# Patient Record
Sex: Male | Born: 1955 | Race: Black or African American | Hispanic: No | Marital: Single | Smoking: Current some day smoker
Health system: Southern US, Community
[De-identification: ages and names within clinical notes are randomized; demographics above are authoritative.]

## PROBLEM LIST (undated history)

## (undated) DIAGNOSIS — K759 Inflammatory liver disease, unspecified: Secondary | ICD-10-CM

## (undated) DIAGNOSIS — F32A Depression, unspecified: Secondary | ICD-10-CM

## (undated) DIAGNOSIS — H9190 Unspecified hearing loss, unspecified ear: Secondary | ICD-10-CM

## (undated) DIAGNOSIS — I10 Essential (primary) hypertension: Secondary | ICD-10-CM

## (undated) DIAGNOSIS — F319 Bipolar disorder, unspecified: Secondary | ICD-10-CM

## (undated) DIAGNOSIS — F1411 Cocaine abuse, in remission: Secondary | ICD-10-CM

## (undated) DIAGNOSIS — K31A0 Gastric intestinal metaplasia, unspecified: Secondary | ICD-10-CM

## (undated) DIAGNOSIS — R0600 Dyspnea, unspecified: Secondary | ICD-10-CM

## (undated) DIAGNOSIS — F1011 Alcohol abuse, in remission: Secondary | ICD-10-CM

## (undated) DIAGNOSIS — K219 Gastro-esophageal reflux disease without esophagitis: Secondary | ICD-10-CM

## (undated) DIAGNOSIS — K3189 Other diseases of stomach and duodenum: Secondary | ICD-10-CM

## (undated) DIAGNOSIS — F329 Major depressive disorder, single episode, unspecified: Secondary | ICD-10-CM

## (undated) DIAGNOSIS — F419 Anxiety disorder, unspecified: Secondary | ICD-10-CM

## (undated) DIAGNOSIS — K7460 Unspecified cirrhosis of liver: Secondary | ICD-10-CM

## (undated) HISTORY — PX: KNEE ARTHROSCOPY: SHX127

## (undated) HISTORY — PX: HAND SURGERY: SHX662

## (undated) HISTORY — PX: COLONOSCOPY: SHX174

---

## 2016-04-29 HISTORY — PX: ESOPHAGOGASTRODUODENOSCOPY: SHX1529

## 2016-05-05 ENCOUNTER — Other Ambulatory Visit: Payer: Self-pay | Admitting: Orthopedic Surgery

## 2016-05-05 DIAGNOSIS — M545 Low back pain: Secondary | ICD-10-CM

## 2016-05-05 DIAGNOSIS — M542 Cervicalgia: Secondary | ICD-10-CM

## 2016-05-16 ENCOUNTER — Other Ambulatory Visit: Payer: Self-pay

## 2016-05-27 ENCOUNTER — Ambulatory Visit
Admission: RE | Admit: 2016-05-27 | Discharge: 2016-05-27 | Disposition: A | Discharge status: Home / Self Care | Payer: Worker's Compensation | Source: Ambulatory Visit | Attending: Orthopedic Surgery | Admitting: Orthopedic Surgery

## 2016-05-27 DIAGNOSIS — M542 Cervicalgia: Secondary | ICD-10-CM

## 2016-05-27 DIAGNOSIS — M545 Low back pain: Secondary | ICD-10-CM

## 2016-08-20 ENCOUNTER — Other Ambulatory Visit: Payer: Self-pay | Admitting: Orthopedic Surgery

## 2016-08-26 ENCOUNTER — Encounter (HOSPITAL_COMMUNITY): Payer: Self-pay | Admitting: *Deleted

## 2016-08-26 NOTE — Progress Notes (Signed)
I called Company secretary, Mr Zelek is not in.  I spoke with Vicky and asked to speak to patient's counselor, she is not in currently.  I told Vicky that patients arrival time has changed and he should arrive at 9:45 AM tomorrow.  Vicky said she would give the information to the person who is bring patient in.

## 2016-08-26 NOTE — Progress Notes (Signed)
Gabriel Haley is a resident at [facility]- he is out of facility at an appointment this morning.  I have left a message to have him call me.Patient has a Veterinary surgeon that oversees medication, counselor is out until 1230 today.  I asked to have medication record faxed if possible. Counselor will have to fax the record.

## 2016-08-27 ENCOUNTER — Encounter (HOSPITAL_COMMUNITY): Payer: Self-pay | Admitting: *Deleted

## 2016-08-27 ENCOUNTER — Ambulatory Visit (HOSPITAL_COMMUNITY)
Admission: RE | Disposition: A | Discharge status: Home / Self Care | Payer: Self-pay | Source: Ambulatory Visit | Attending: Orthopedic Surgery

## 2016-08-27 ENCOUNTER — Ambulatory Visit (HOSPITAL_COMMUNITY): Payer: Worker's Compensation | Admitting: Certified Registered Nurse Anesthetist

## 2016-08-27 ENCOUNTER — Ambulatory Visit (HOSPITAL_COMMUNITY): Payer: Worker's Compensation

## 2016-08-27 ENCOUNTER — Observation Stay (HOSPITAL_COMMUNITY)
Admission: RE | Admit: 2016-08-27 | Discharge: 2016-08-28 | Disposition: A | Discharge status: Home / Self Care | Payer: Worker's Compensation | Source: Ambulatory Visit | Attending: Orthopedic Surgery | Admitting: Orthopedic Surgery

## 2016-08-27 DIAGNOSIS — F319 Bipolar disorder, unspecified: Secondary | ICD-10-CM | POA: Diagnosis not present

## 2016-08-27 DIAGNOSIS — M5412 Radiculopathy, cervical region: Secondary | ICD-10-CM | POA: Diagnosis not present

## 2016-08-27 DIAGNOSIS — F17210 Nicotine dependence, cigarettes, uncomplicated: Secondary | ICD-10-CM | POA: Diagnosis not present

## 2016-08-27 DIAGNOSIS — Z419 Encounter for procedure for purposes other than remedying health state, unspecified: Secondary | ICD-10-CM

## 2016-08-27 DIAGNOSIS — M5410 Radiculopathy, site unspecified: Secondary | ICD-10-CM | POA: Diagnosis present

## 2016-08-27 DIAGNOSIS — I10 Essential (primary) hypertension: Secondary | ICD-10-CM | POA: Diagnosis not present

## 2016-08-27 DIAGNOSIS — M4802 Spinal stenosis, cervical region: Secondary | ICD-10-CM | POA: Insufficient documentation

## 2016-08-27 DIAGNOSIS — M79602 Pain in left arm: Secondary | ICD-10-CM | POA: Diagnosis present

## 2016-08-27 DIAGNOSIS — M542 Cervicalgia: Secondary | ICD-10-CM

## 2016-08-27 DIAGNOSIS — Z79899 Other long term (current) drug therapy: Secondary | ICD-10-CM | POA: Insufficient documentation

## 2016-08-27 DIAGNOSIS — K219 Gastro-esophageal reflux disease without esophagitis: Secondary | ICD-10-CM | POA: Diagnosis not present

## 2016-08-27 DIAGNOSIS — Z01818 Encounter for other preprocedural examination: Secondary | ICD-10-CM

## 2016-08-27 HISTORY — DX: Gastric intestinal metaplasia, unspecified: K31.A0

## 2016-08-27 HISTORY — DX: Cocaine abuse, in remission: F14.11

## 2016-08-27 HISTORY — DX: Bipolar disorder, unspecified: F31.9

## 2016-08-27 HISTORY — DX: Gastro-esophageal reflux disease without esophagitis: K21.9

## 2016-08-27 HISTORY — DX: Other diseases of stomach and duodenum: K31.89

## 2016-08-27 HISTORY — DX: Alcohol abuse, in remission: F10.11

## 2016-08-27 HISTORY — DX: Major depressive disorder, single episode, unspecified: F32.9

## 2016-08-27 HISTORY — DX: Unspecified hearing loss, unspecified ear: H91.90

## 2016-08-27 HISTORY — DX: Unspecified cirrhosis of liver: K74.60

## 2016-08-27 HISTORY — PX: ANTERIOR CERVICAL DECOMP/DISCECTOMY FUSION: SHX1161

## 2016-08-27 HISTORY — DX: Essential (primary) hypertension: I10

## 2016-08-27 HISTORY — DX: Inflammatory liver disease, unspecified: K75.9

## 2016-08-27 HISTORY — DX: Anxiety disorder, unspecified: F41.9

## 2016-08-27 HISTORY — DX: Dyspnea, unspecified: R06.00

## 2016-08-27 HISTORY — DX: Depression, unspecified: F32.A

## 2016-08-27 LAB — SURGICAL PCR SCREEN
MRSA, PCR: NEGATIVE
STAPHYLOCOCCUS AUREUS: NEGATIVE

## 2016-08-27 LAB — COMPREHENSIVE METABOLIC PANEL
ALT: 15 U/L — ABNORMAL LOW (ref 17–63)
ANION GAP: 7 (ref 5–15)
AST: 18 U/L (ref 15–41)
Albumin: 3.8 g/dL (ref 3.5–5.0)
Alkaline Phosphatase: 93 U/L (ref 38–126)
BUN: 10 mg/dL (ref 6–20)
CO2: 24 mmol/L (ref 22–32)
Calcium: 9.3 mg/dL (ref 8.9–10.3)
Chloride: 107 mmol/L (ref 101–111)
Creatinine, Ser: 1.22 mg/dL (ref 0.61–1.24)
GFR calc Af Amer: >60 mL/min (ref >60)
GFR calc non Af Amer: >60 mL/min (ref >60)
Glucose, Bld: 102 mg/dL — ABNORMAL HIGH (ref 65–99)
POTASSIUM: 4.2 mmol/L (ref 3.5–5.1)
SODIUM: 138 mmol/L (ref 135–145)
TOTAL PROTEIN: 7.8 g/dL (ref 6.5–8.1)
Total Bilirubin: 0.7 mg/dL (ref 0.3–1.2)

## 2016-08-27 LAB — CBC WITH DIFFERENTIAL/PLATELET
BASOS PCT: 0 %
Basophils Absolute: 0 10*3/uL (ref 0.0–0.1)
EOS ABS: 0.1 10*3/uL (ref 0.0–0.7)
EOS PCT: 1 %
HCT: 37.3 % — ABNORMAL LOW (ref 39.0–52.0)
HEMOGLOBIN: 12.6 g/dL — AB (ref 13.0–17.0)
LYMPHS PCT: 59 %
Lymphs Abs: 4.7 10*3/uL — ABNORMAL HIGH (ref 0.7–4.0)
MCH: 31 pg (ref 26.0–34.0)
MCHC: 33.8 g/dL (ref 30.0–36.0)
MCV: 91.9 fL (ref 78.0–100.0)
MONO ABS: 0.5 10*3/uL (ref 0.1–1.0)
Monocytes Relative: 6 %
NEUTROS PCT: 34 %
Neutro Abs: 2.8 10*3/uL (ref 1.7–7.7)
PLATELETS: 177 10*3/uL (ref 150–400)
RBC: 4.06 MIL/uL — AB (ref 4.22–5.81)
RDW: 13 % (ref 11.5–15.5)
WBC: 8.1 10*3/uL (ref 4.0–10.5)

## 2016-08-27 LAB — PROTIME-INR
INR: 1.09
PROTHROMBIN TIME: 14.2 s (ref 11.4–15.2)

## 2016-08-27 LAB — APTT: aPTT: 32 seconds (ref 24–36)

## 2016-08-27 SURGERY — ANTERIOR CERVICAL DECOMPRESSION/DISCECTOMY FUSION 2 LEVELS
Anesthesia: General | Site: Spine Cervical | Laterality: Left

## 2016-08-27 MED ORDER — ZOLPIDEM TARTRATE 5 MG PO TABS: 5.0000 mg | ORAL_TABLET | Freq: Every evening | ORAL | Status: DC | PRN

## 2016-08-27 MED ORDER — HYDRALAZINE HCL 20 MG/ML IJ SOLN
10.0000 mg | Freq: Once | INTRAMUSCULAR | Status: AC
  Administered 2016-08-27: 10 mg via INTRAVENOUS

## 2016-08-27 MED ORDER — OXYCODONE-ACETAMINOPHEN 5-325 MG PO TABS
ORAL_TABLET | ORAL | Status: AC
  Filled 2016-08-27: qty 2

## 2016-08-27 MED ORDER — MENTHOL 3 MG MT LOZG: 1.0000 | LOZENGE | OROMUCOSAL | Status: DC | PRN

## 2016-08-27 MED ORDER — HYDROMORPHONE HCL 1 MG/ML IJ SOLN
INTRAMUSCULAR | Status: AC
  Administered 2016-08-27: 0.5 mg via INTRAVENOUS
  Filled 2016-08-27: qty 1

## 2016-08-27 MED ORDER — CLARITHROMYCIN 500 MG PO TABS
500.0000 mg | ORAL_TABLET | Freq: Two times a day (BID) | ORAL | Status: DC
  Administered 2016-08-27: 500 mg via ORAL
  Filled 2016-08-27 (×2): qty 1

## 2016-08-27 MED ORDER — SUCCINYLCHOLINE CHLORIDE 20 MG/ML IJ SOLN
INTRAMUSCULAR | Status: DC | PRN
  Administered 2016-08-27: 100 mg via INTRAVENOUS

## 2016-08-27 MED ORDER — MORPHINE SULFATE (PF) 4 MG/ML IV SOLN: 1.0000 mg | INTRAVENOUS | Status: DC | PRN

## 2016-08-27 MED ORDER — PROPOFOL 10 MG/ML IV BOLUS
INTRAVENOUS | Status: DC | PRN
  Administered 2016-08-27: 200 mg via INTRAVENOUS

## 2016-08-27 MED ORDER — LURASIDONE HCL 40 MG PO TABS
40.0000 mg | ORAL_TABLET | Freq: Every day | ORAL | Status: DC
  Administered 2016-08-27: 40 mg via ORAL
  Filled 2016-08-27: qty 1

## 2016-08-27 MED ORDER — EPINEPHRINE PF 1 MG/ML IJ SOLN
INTRAMUSCULAR | Status: AC
  Filled 2016-08-27: qty 1

## 2016-08-27 MED ORDER — LIDOCAINE HCL (CARDIAC) 20 MG/ML IV SOLN
INTRAVENOUS | Status: DC | PRN
  Administered 2016-08-27: 60 mg via INTRAVENOUS

## 2016-08-27 MED ORDER — DIAZEPAM 5 MG PO TABS
5.0000 mg | ORAL_TABLET | Freq: Four times a day (QID) | ORAL | Status: DC | PRN
  Administered 2016-08-27: 5 mg via ORAL

## 2016-08-27 MED ORDER — PROPOFOL 10 MG/ML IV BOLUS
INTRAVENOUS | Status: AC
  Filled 2016-08-27: qty 40

## 2016-08-27 MED ORDER — PANTOPRAZOLE SODIUM 40 MG PO TBEC: 80.0000 mg | DELAYED_RELEASE_TABLET | Freq: Every day | ORAL | Status: DC

## 2016-08-27 MED ORDER — ONDANSETRON HCL 4 MG/2ML IJ SOLN
INTRAMUSCULAR | Status: AC
  Filled 2016-08-27: qty 2

## 2016-08-27 MED ORDER — BISACODYL 5 MG PO TBEC: 5.0000 mg | DELAYED_RELEASE_TABLET | Freq: Every day | ORAL | Status: DC | PRN

## 2016-08-27 MED ORDER — FENTANYL CITRATE (PF) 250 MCG/5ML IJ SOLN
INTRAMUSCULAR | Status: AC
Start: 2016-08-27 — End: 2016-08-27
  Filled 2016-08-27: qty 5

## 2016-08-27 MED ORDER — POVIDONE-IODINE 7.5 % EX SOLN: Freq: Once | CUTANEOUS | Status: DC

## 2016-08-27 MED ORDER — THROMBIN 20000 UNITS EX SOLR
CUTANEOUS | Status: AC
  Filled 2016-08-27: qty 20000

## 2016-08-27 MED ORDER — HYDRALAZINE HCL 20 MG/ML IJ SOLN
INTRAMUSCULAR | Status: AC
  Filled 2016-08-27: qty 1

## 2016-08-27 MED ORDER — MIDAZOLAM HCL 2 MG/2ML IJ SOLN
INTRAMUSCULAR | Status: DC | PRN
  Administered 2016-08-27: 2 mg via INTRAVENOUS

## 2016-08-27 MED ORDER — ATORVASTATIN CALCIUM 20 MG PO TABS
40.0000 mg | ORAL_TABLET | Freq: Every day | ORAL | Status: DC
  Administered 2016-08-27: 40 mg via ORAL
  Filled 2016-08-27: qty 2

## 2016-08-27 MED ORDER — ONDANSETRON HCL 4 MG/2ML IJ SOLN: 4.0000 mg | Freq: Four times a day (QID) | INTRAMUSCULAR | Status: DC | PRN

## 2016-08-27 MED ORDER — THROMBIN 20000 UNITS EX KIT
PACK | CUTANEOUS | Status: DC | PRN
  Administered 2016-08-27: 20000 [IU] via TOPICAL

## 2016-08-27 MED ORDER — HYDROMORPHONE HCL 1 MG/ML IJ SOLN
0.2500 mg | INTRAMUSCULAR | Status: DC | PRN
  Administered 2016-08-27 (×2): 0.5 mg via INTRAVENOUS

## 2016-08-27 MED ORDER — POTASSIUM CHLORIDE IN NACL 20-0.9 MEQ/L-% IV SOLN: INTRAVENOUS | Status: DC

## 2016-08-27 MED ORDER — MIDAZOLAM HCL 2 MG/2ML IJ SOLN
INTRAMUSCULAR | Status: AC
  Filled 2016-08-27: qty 2

## 2016-08-27 MED ORDER — LACTATED RINGERS IV SOLN
INTRAVENOUS | Status: DC | PRN
  Administered 2016-08-27 (×2): via INTRAVENOUS

## 2016-08-27 MED ORDER — PHENOL 1.4 % MT LIQD
1.0000 | OROMUCOSAL | Status: DC | PRN
  Administered 2016-08-28: 1 via OROMUCOSAL
  Filled 2016-08-27: qty 177

## 2016-08-27 MED ORDER — ACETAMINOPHEN 325 MG PO TABS: 650.0000 mg | ORAL_TABLET | ORAL | Status: DC | PRN

## 2016-08-27 MED ORDER — SENNOSIDES-DOCUSATE SODIUM 8.6-50 MG PO TABS: 1.0000 | ORAL_TABLET | Freq: Every evening | ORAL | Status: DC | PRN

## 2016-08-27 MED ORDER — FENTANYL CITRATE (PF) 100 MCG/2ML IJ SOLN
INTRAMUSCULAR | Status: DC | PRN
  Administered 2016-08-27: 50 ug via INTRAVENOUS
  Administered 2016-08-27: 100 ug via INTRAVENOUS
  Administered 2016-08-27 (×2): 50 ug via INTRAVENOUS
  Administered 2016-08-27: 150 ug via INTRAVENOUS

## 2016-08-27 MED ORDER — HEMOSTATIC AGENTS (NO CHARGE) OPTIME
TOPICAL | Status: DC | PRN
  Administered 2016-08-27: 1 via TOPICAL

## 2016-08-27 MED ORDER — ONDANSETRON HCL 4 MG/2ML IJ SOLN
INTRAMUSCULAR | Status: DC | PRN
  Administered 2016-08-27: 4 mg via INTRAVENOUS

## 2016-08-27 MED ORDER — ACETAMINOPHEN 650 MG RE SUPP: 650.0000 mg | RECTAL | Status: DC | PRN

## 2016-08-27 MED ORDER — SUGAMMADEX SODIUM 200 MG/2ML IV SOLN
INTRAVENOUS | Status: AC
  Filled 2016-08-27: qty 2

## 2016-08-27 MED ORDER — PANTOPRAZOLE SODIUM 40 MG IV SOLR: 40.0000 mg | Freq: Every day | INTRAVENOUS | Status: DC

## 2016-08-27 MED ORDER — BUPIVACAINE HCL (PF) 0.25 % IJ SOLN
INTRAMUSCULAR | Status: AC
  Filled 2016-08-27: qty 30

## 2016-08-27 MED ORDER — OXYCODONE-ACETAMINOPHEN 5-325 MG PO TABS
1.0000 | ORAL_TABLET | ORAL | Status: DC | PRN
  Administered 2016-08-27 – 2016-08-28 (×4): 2 via ORAL
  Filled 2016-08-27 (×3): qty 2

## 2016-08-27 MED ORDER — DOCUSATE SODIUM 100 MG PO CAPS
100.0000 mg | ORAL_CAPSULE | Freq: Two times a day (BID) | ORAL | Status: DC
  Administered 2016-08-27: 100 mg via ORAL
  Filled 2016-08-27: qty 1

## 2016-08-27 MED ORDER — SODIUM CHLORIDE 0.9% FLUSH: 3.0000 mL | INTRAVENOUS | Status: DC | PRN

## 2016-08-27 MED ORDER — CEFAZOLIN SODIUM-DEXTROSE 2-4 GM/100ML-% IV SOLN
2.0000 g | INTRAVENOUS | Status: AC
Start: 2016-08-27 — End: 2016-08-27
  Administered 2016-08-27: 2 g via INTRAVENOUS
  Filled 2016-08-27: qty 100

## 2016-08-27 MED ORDER — MUPIROCIN 2 % EX OINT
1.0000 "application " | TOPICAL_OINTMENT | Freq: Two times a day (BID) | CUTANEOUS | Status: DC
  Administered 2016-08-27: 1 via TOPICAL

## 2016-08-27 MED ORDER — LACTATED RINGERS IV SOLN
INTRAVENOUS | Status: DC
  Administered 2016-08-27: 11:00:00 via INTRAVENOUS

## 2016-08-27 MED ORDER — ALUM & MAG HYDROXIDE-SIMETH 200-200-20 MG/5ML PO SUSP: 30.0000 mL | Freq: Four times a day (QID) | ORAL | Status: DC | PRN

## 2016-08-27 MED ORDER — ONDANSETRON HCL 4 MG PO TABS: 4.0000 mg | ORAL_TABLET | Freq: Four times a day (QID) | ORAL | Status: DC | PRN

## 2016-08-27 MED ORDER — SODIUM CHLORIDE 0.9% FLUSH
3.0000 mL | Freq: Two times a day (BID) | INTRAVENOUS | Status: DC
  Administered 2016-08-27: 3 mL via INTRAVENOUS

## 2016-08-27 MED ORDER — DIAZEPAM 5 MG PO TABS
ORAL_TABLET | ORAL | Status: AC
  Filled 2016-08-27: qty 1

## 2016-08-27 MED ORDER — BUPIVACAINE LIPOSOME 1.3 % IJ SUSP
20.0000 mL | INTRAMUSCULAR | Status: DC
  Filled 2016-08-27: qty 20

## 2016-08-27 MED ORDER — CEFAZOLIN SODIUM-DEXTROSE 2-4 GM/100ML-% IV SOLN
2.0000 g | Freq: Three times a day (TID) | INTRAVENOUS | Status: AC
  Administered 2016-08-27 – 2016-08-28 (×2): 2 g via INTRAVENOUS
  Filled 2016-08-27 (×2): qty 100

## 2016-08-27 MED ORDER — ROCURONIUM BROMIDE 100 MG/10ML IV SOLN
INTRAVENOUS | Status: DC | PRN
  Administered 2016-08-27: 20 mg via INTRAVENOUS
  Administered 2016-08-27: 40 mg via INTRAVENOUS
  Administered 2016-08-27: 10 mg via INTRAVENOUS

## 2016-08-27 MED ORDER — AMLODIPINE BESYLATE 5 MG PO TABS
5.0000 mg | ORAL_TABLET | Freq: Every day | ORAL | Status: DC
  Administered 2016-08-27: 5 mg via ORAL
  Filled 2016-08-27: qty 1

## 2016-08-27 MED ORDER — SUGAMMADEX SODIUM 200 MG/2ML IV SOLN
INTRAVENOUS | Status: DC | PRN
  Administered 2016-08-27: 200 mg via INTRAVENOUS

## 2016-08-27 MED ORDER — FLEET ENEMA 7-19 GM/118ML RE ENEM: 1.0000 | ENEMA | Freq: Once | RECTAL | Status: DC | PRN

## 2016-08-27 MED ORDER — BUPIVACAINE-EPINEPHRINE 0.25% -1:200000 IJ SOLN
INTRAMUSCULAR | Status: DC | PRN
  Administered 2016-08-27: 10 mL

## 2016-08-27 MED ORDER — MUPIROCIN 2 % EX OINT
TOPICAL_OINTMENT | CUTANEOUS | Status: AC
  Administered 2016-08-27: 1 via TOPICAL
  Filled 2016-08-27: qty 22

## 2016-08-27 MED ORDER — BENAZEPRIL HCL 20 MG PO TABS
20.0000 mg | ORAL_TABLET | Freq: Every day | ORAL | Status: DC
  Administered 2016-08-27: 20 mg via ORAL
  Filled 2016-08-27: qty 1

## 2016-08-27 MED ORDER — MORPHINE SULFATE (PF) 2 MG/ML IV SOLN: 1.0000 mg | INTRAVENOUS | Status: DC | PRN

## 2016-08-27 MED ORDER — AMOXICILLIN 500 MG PO CAPS
1000.0000 mg | ORAL_CAPSULE | Freq: Two times a day (BID) | ORAL | Status: DC
  Administered 2016-08-27: 1000 mg via ORAL
  Filled 2016-08-27 (×2): qty 2

## 2016-08-27 MED ORDER — SODIUM CHLORIDE 0.9 % IV SOLN: 250.0000 mL | INTRAVENOUS | Status: DC

## 2016-08-27 MED ORDER — 0.9 % SODIUM CHLORIDE (POUR BTL) OPTIME
TOPICAL | Status: DC | PRN
  Administered 2016-08-27: 1000 mL

## 2016-08-27 MED ORDER — FENTANYL CITRATE (PF) 250 MCG/5ML IJ SOLN
INTRAMUSCULAR | Status: AC
  Filled 2016-08-27: qty 5

## 2016-08-27 SURGICAL SUPPLY — 76 items
BENZOIN TINCTURE PRP APPL 2/3 (GAUZE/BANDAGES/DRESSINGS) ×3 IMPLANT
BIT DRILL NEURO 2X3.1 SFT TUCH (MISCELLANEOUS) ×1 IMPLANT
BIT DRILL SRG 14X2.2XFLT CHK (BIT) ×1 IMPLANT
BIT DRL SRG 14X2.2XFLT CHK (BIT) ×1
BLADE CLIPPER SURG (BLADE) ×6 IMPLANT
BLADE SURG 15 STRL LF DISP TIS (BLADE) ×1 IMPLANT
BLADE SURG 15 STRL SS (BLADE) ×2
BONE VIVIGEN FORMABLE 1.3CC (Bone Implant) ×3 IMPLANT
BUR MATCHSTICK NEURO 3.0 LAGG (BURR) IMPLANT
CARTRIDGE OIL MAESTRO DRILL (MISCELLANEOUS) ×1 IMPLANT
CLOSURE WOUND 1/2 X4 (GAUZE/BANDAGES/DRESSINGS) ×1
COLLAR CERV LO CONTOUR FIRM DE (SOFTGOODS) IMPLANT
CORDS BIPOLAR (ELECTRODE) ×3 IMPLANT
COVER MAYO STAND STRL (DRAPES) ×3 IMPLANT
COVER SURGICAL LIGHT HANDLE (MISCELLANEOUS) ×3 IMPLANT
CRADLE DONUT ADULT HEAD (MISCELLANEOUS) ×3 IMPLANT
DECANTER SPIKE VIAL GLASS SM (MISCELLANEOUS) ×3 IMPLANT
DIFFUSER DRILL AIR PNEUMATIC (MISCELLANEOUS) ×3 IMPLANT
DRAIN JACKSON RD 7FR 3/32 (WOUND CARE) IMPLANT
DRAPE C-ARM 42X72 X-RAY (DRAPES) ×3 IMPLANT
DRAPE POUCH INSTRU U-SHP 10X18 (DRAPES) ×3 IMPLANT
DRAPE SURG 17X23 STRL (DRAPES) ×12 IMPLANT
DRILL BIT SKYLINE 14MM (BIT) ×2
DRILL NEURO 2X3.1 SOFT TOUCH (MISCELLANEOUS) ×3
DURAPREP 26ML APPLICATOR (WOUND CARE) ×3 IMPLANT
ELECT COATED BLADE 2.86 ST (ELECTRODE) ×3 IMPLANT
ELECT REM PT RETURN 9FT ADLT (ELECTROSURGICAL) ×3
ELECTRODE REM PT RTRN 9FT ADLT (ELECTROSURGICAL) ×1 IMPLANT
EVACUATOR SILICONE 100CC (DRAIN) IMPLANT
GAUZE SPONGE 4X4 12PLY STRL (GAUZE/BANDAGES/DRESSINGS) ×3 IMPLANT
GAUZE SPONGE 4X4 16PLY XRAY LF (GAUZE/BANDAGES/DRESSINGS) ×3 IMPLANT
GLOVE BIO SURGEON STRL SZ7 (GLOVE) ×3 IMPLANT
GLOVE BIO SURGEON STRL SZ8 (GLOVE) ×3 IMPLANT
GLOVE BIOGEL PI IND STRL 7.0 (GLOVE) ×2 IMPLANT
GLOVE BIOGEL PI IND STRL 8 (GLOVE) ×1 IMPLANT
GLOVE BIOGEL PI INDICATOR 7.0 (GLOVE) ×4
GLOVE BIOGEL PI INDICATOR 8 (GLOVE) ×2
GOWN STRL REUS W/ TWL LRG LVL3 (GOWN DISPOSABLE) ×1 IMPLANT
GOWN STRL REUS W/ TWL XL LVL3 (GOWN DISPOSABLE) ×1 IMPLANT
GOWN STRL REUS W/TWL LRG LVL3 (GOWN DISPOSABLE) ×2
GOWN STRL REUS W/TWL XL LVL3 (GOWN DISPOSABLE) ×2
INTERLOCK LRDTC CRVCL VBR 6MM (Bone Implant) ×1 IMPLANT
INTERLOCK LRDTC CRVCL VBR 7MM (Bone Implant) ×1 IMPLANT
IV CATH 14GX2 1/4 (CATHETERS) ×3 IMPLANT
KIT BASIN OR (CUSTOM PROCEDURE TRAY) ×3 IMPLANT
KIT ROOM TURNOVER OR (KITS) ×3 IMPLANT
LORDOTIC CERVICAL VBR 6MM SM (Bone Implant) ×3 IMPLANT
LORDOTIC CERVICAL VBR 7MM SM (Bone Implant) ×3 IMPLANT
MANIFOLD NEPTUNE II (INSTRUMENTS) ×3 IMPLANT
NEEDLE PRECISIONGLIDE 27X1.5 (NEEDLE) ×3 IMPLANT
NEEDLE SPNL 20GX3.5 QUINCKE YW (NEEDLE) ×3 IMPLANT
NS IRRIG 1000ML POUR BTL (IV SOLUTION) ×3 IMPLANT
OIL CARTRIDGE MAESTRO DRILL (MISCELLANEOUS) ×3
PACK ORTHO CERVICAL (CUSTOM PROCEDURE TRAY) ×3 IMPLANT
PAD ARMBOARD 7.5X6 YLW CONV (MISCELLANEOUS) ×6 IMPLANT
PATTIES SURGICAL .5 X.5 (GAUZE/BANDAGES/DRESSINGS) IMPLANT
PATTIES SURGICAL .5 X1 (DISPOSABLE) ×3 IMPLANT
PIN DISTRACTION 14 (PIN) ×6 IMPLANT
PLATE SKYLINE TWO LEVEL 28MM (Plate) ×3 IMPLANT
SCREW SKYLINE VAR OS 14MM (Screw) ×18 IMPLANT
SPONGE INTESTINAL PEANUT (DISPOSABLE) ×6 IMPLANT
SPONGE SURGIFOAM ABS GEL 100 (HEMOSTASIS) ×3 IMPLANT
STRIP CLOSURE SKIN 1/2X4 (GAUZE/BANDAGES/DRESSINGS) ×2 IMPLANT
SURGIFLO W/THROMBIN 8M KIT (HEMOSTASIS) IMPLANT
SUT MNCRL AB 4-0 PS2 18 (SUTURE) ×3 IMPLANT
SUT SILK 4 0 (SUTURE)
SUT SILK 4-0 18XBRD TIE 12 (SUTURE) IMPLANT
SUT VIC AB 2-0 CT2 18 VCP726D (SUTURE) ×3 IMPLANT
SYR BULB IRRIGATION 50ML (SYRINGE) ×3 IMPLANT
SYR CONTROL 10ML LL (SYRINGE) ×9 IMPLANT
TAPE CLOTH 4X10 WHT NS (GAUZE/BANDAGES/DRESSINGS) ×3 IMPLANT
TAPE UMBILICAL COTTON 1/8X30 (MISCELLANEOUS) ×3 IMPLANT
TOWEL OR 17X24 6PK STRL BLUE (TOWEL DISPOSABLE) ×3 IMPLANT
TOWEL OR 17X26 10 PK STRL BLUE (TOWEL DISPOSABLE) ×3 IMPLANT
WATER STERILE IRR 1000ML POUR (IV SOLUTION) ×3 IMPLANT
YANKAUER SUCT BULB TIP NO VENT (SUCTIONS) ×3 IMPLANT

## 2016-08-27 NOTE — Transfer of Care (Signed)
Immediate Anesthesia Transfer of Care Note  Patient: Gabriel Haley  Procedure(s) Performed: Procedure(s) with comments: ANTERIOR CERVICAL DECOMPRESSION FUSION, CERVICAL 3-4, CERVICAL 4-5 WITH INSTRUMENATION AND ALLOGRAFT (Left) - ANTERIOR CERVICAL DECOMPRESSION FUSION, CERVICAL 3-4, CERVICAL 4-5 WITH INSTRUMENATION AND ALLOGRAFT  Patient Location: PACU  Anesthesia Type:General  Level of Consciousness: awake, alert  and oriented  Airway & Oxygen Therapy: Patient Spontanous Breathing and Patient connected to nasal cannula oxygen  Post-op Assessment: Report given to RN, Post -op Vital signs reviewed and stable and Patient moving all extremities  Post vital signs: Reviewed and stable  Last Vitals:  Vitals:   08/27/16 1103 08/27/16 1615  BP: (!) 152/80   Pulse: (!) 51   Resp: 20   Temp: 36.8 C (P) 36.3 C    Last Pain:  Vitals:   08/27/16 1103  TempSrc: Oral  PainSc:       Patients Stated Pain Goal: 5 (08/27/16 1021)  Complications: No apparent anesthesia complications

## 2016-08-27 NOTE — Anesthesia Procedure Notes (Signed)
Procedure Name: Intubation Date/Time: 08/27/2016 1:03 PM Performed by: Gwenyth Allegra Pre-anesthesia Checklist: Patient identified, Emergency Drugs available, Suction available and Patient being monitored Patient Re-evaluated:Patient Re-evaluated prior to inductionOxygen Delivery Method: Circle system utilized Preoxygenation: Pre-oxygenation with 100% oxygen Intubation Type: IV induction Ventilation: Mask ventilation without difficulty Grade View: Grade I Tube type: Oral Tube size: 8.0 mm Number of attempts: 1 Airway Equipment and Method: Stylet and Video-laryngoscopy Placement Confirmation: ETT inserted through vocal cords under direct vision and breath sounds checked- equal and bilateral Secured at: 23 cm Tube secured with: Tape Dental Injury: Teeth and Oropharynx as per pre-operative assessment
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
## 2016-08-27 NOTE — Progress Notes (Signed)
Orthopedic Tech Progress Note Patient Details:  Gabriel Haley [birth year] [id number]  Ortho Devices Type of Ortho Device: [place] cervical collar Ortho Device/Splint Location: bedside Ortho Device/Splint Interventions: Casandra Doffing 08/27/2016, 6:52 PM

## 2016-08-28 ENCOUNTER — Encounter (HOSPITAL_COMMUNITY): Payer: Self-pay | Admitting: Orthopedic Surgery

## 2016-08-28 DIAGNOSIS — M5412 Radiculopathy, cervical region: Secondary | ICD-10-CM | POA: Diagnosis not present

## 2016-08-28 MED ORDER — DEXAMETHASONE 4 MG PO TABS
4.0000 mg | ORAL_TABLET | Freq: Every day | ORAL | Status: AC
  Administered 2016-08-28: 4 mg via ORAL
  Filled 2016-08-28: qty 1

## 2016-08-28 MED FILL — Thrombin For Soln 20000 Unit: CUTANEOUS | Qty: 1 | Status: AC
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
## 2016-08-28 NOTE — Anesthesia Postprocedure Evaluation (Addendum)
Anesthesia Post Note  Patient: Gregary CromerWilliam E Varon  Procedure(s) Performed: Procedure(s) (LRB): ANTERIOR CERVICAL DECOMPRESSION FUSION, CERVICAL 3-4, CERVICAL 4-5 WITH INSTRUMENATION AND ALLOGRAFT (Left)  Patient location during evaluation: PACU Anesthesia Type: General Level of consciousness: awake and alert Pain management: pain level controlled Vital Signs Assessment: post-procedure vital signs reviewed and stable Respiratory status: spontaneous breathing, nonlabored ventilation, respiratory function stable and patient connected to nasal cannula oxygen Cardiovascular status: blood pressure returned to baseline and stable Postop Assessment: no signs of nausea or vomiting Anesthetic complications: no       Last Vitals:  Vitals:   08/28/16 0400 08/28/16 0805  BP: (!) 146/97 131/86  Pulse: 80 65  Resp: 20 20  Temp: 37 C 37 C    Last Pain:  Vitals:   08/28/16 0900  TempSrc:   PainSc: 4                  Jessicah Croll

## 2016-08-28 NOTE — Progress Notes (Signed)
Pt doing well. Pt and mother given D/C instructions with Rx's, verbal understanding was provided. Pt's incision is clean and dry with no sign of infection. Pt's IV was removed prior to D/C. Pt D/C'd home via wheelchair @ 1030 per MD order. Pt is stable @ D/C and has no other needs at this time. Rema FendtAshley Vyctoria Dickman, RN

## 2016-08-28 NOTE — Progress Notes (Signed)
    Patient doing well Patient denies R or L arm pain Able to tolerate solids and liquids and pills, but with discomfort   Physical Exam: Vitals:   08/28/16 0005 08/28/16 0400  BP: 135/87 (!) 146/97  Pulse: 85 80  Resp: 20 20  Temp: 98.6 F (37 C) 98.6 F (37 C)   Neck soft/supple Dressing in place NVI  POD #1 s/p C3-5 ACDF doing well  - encourage ambulation - Percocet for pain, Valium for muscle spasms - likely d/c home later today

## 2016-08-28 NOTE — Discharge Instructions (Signed)
Anterior Cervical Diskectomy and Fusion, Care After Refer to this sheet in the next few weeks. These instructions provide you with information about caring for yourself after your procedure. Your health care provider may also give you more specific instructions. Your treatment has been planned according to current medical practices, but problems sometimes occur. Call your health care provider if you have any problems or questions after your procedure. What can I expect after the procedure? After the procedure, it is common to have:  Neck pain.  Discomfort when swallowing.  Slight hoarseness. Follow these instructions at home: If You Have a Neck Brace:   Wear it as told by your health care provider. Remove it only as told by your health care provider.  Keep the brace clean and dry. Incision care    Follow instructions from your health care provider about how to take care of the cut made during surgery (incision). Make sure you:  Wash your hands with soap and water before you change your bandage (dressing). If soap and water are not available, use hand sanitizer.  Change your dressing as told by your health care provider.  Leave stitches (sutures), skin glue, or adhesive strips in place. These skin closures may need to stay in place for 2 weeks or longer. If adhesive strip edges start to loosen and curl up, you may trim the loose edges. Do not remove adhesive strips completely unless your health care provider tells you to do that.  Check your incision every day for signs of infection. Watch for:  Redness, swelling, or pain.  Fluid or blood.  Warmth.  Pus or a bad smell. Managing pain, stiffness, and swelling   Take over-the-counter and prescription medicines only as told by your health care provider.  If directed, apply ice to the injured area.  Put ice in a plastic bag.  Place a towel between your skin and the bag.  Leave the ice on for 20 minutes, 2-3 times per  day. Activity   Return to your normal activities as told by your health care provider. Ask your health care provider what activities are safe for you.  Do exercises as told by your health care provider.  Do not lift anything that is heavier than 10 lb (4.5 kg). General instructions   Do not drive or operate heavy machinery while taking prescription pain medicines.  Do not use any tobacco products, including cigarettes, chewing tobacco, or e-cigarettes. Tobacco can delay healing. If you need help quitting, ask your health care provider.  Keep all follow-up visits and physical therapy appointments as told by your health care provider. This is important. Contact a health care provider if:  You have a fever.  You have redness, swelling, or pain around your incision.  You have fluid or blood coming from your incision.  You have pus or a bad smell coming from your incision.  You have pain that is not controlled by your pain medicine.  You have increasing hoarseness or trouble swallowing. Get help right away if:  You have severe pain.  You have sudden numbness or weakness in your arms.  You have warmth, tenderness, or swelling in your calf.  You have chest pain.  You have difficulty breathing. This information is not intended to replace advice given to you by your health care provider. Make sure you discuss any questions you have with your health care provider. Document Released: 05/11/2015 Document Revised: 09/20/2015 Document Reviewed: 03/29/2015 Elsevier Interactive Patient Education  2017 [employer]

## 2016-09-03 NOTE — Discharge Summary (Signed)
Patient ID: Gabriel Haley MRN: [id number] DOB/AGE: 11-19-1955 61 y.o.  Admit date: 08/27/2016 Discharge date: 08/28/2016  Admission Diagnoses:  Active Problems:   Gabriel Haley   Discharge Diagnoses:  Same  Past Medical History:  Diagnosis Date  . Anxiety    attack 10 years ago (08/26/16)  . Bipolar disorder (HCC)   . Cirrhosis of liver (HCC)   . Depression   . Dyspnea    exertion  . GERD (gastroesophageal reflux disease)   . Hearing loss    left  . Hepatitis    Hepatitis C  . History of cocaine abuse   . History of ETOH abuse   . Hypertension   . Intestinal metaplasia of gastric mucosa    HelicoBacter Pyloric gastritis    Surgeries: Procedure(s): ANTERIOR CERVICAL DECOMPRESSION FUSION, CERVICAL 3-4, CERVICAL 4-5 WITH INSTRUMENATION AND ALLOGRAFT on 08/27/2016   Consultants:  None  Discharged Condition: Improved  Hospital Course: Gabriel Haley is an 61 y.o. male who was admitted 08/27/2016 for operative treatment of Gabriel Haley. Patient has severe unremitting pain that affects sleep, daily activities, and work/hobbies. After pre-op clearance the patient was taken to the operating room on 08/27/2016 and underwent  Procedure(s): ANTERIOR CERVICAL DECOMPRESSION FUSION, CERVICAL 3-4, CERVICAL 4-5 WITH INSTRUMENATION AND ALLOGRAFT.    Patient was given perioperative antibiotics:  Anti-infectives    Start     Dose/Rate Route Frequency Ordered Stop   08/27/16 2200  amoxicillin (AMOXIL) capsule 1,000 mg  Status:  Discontinued     1,000 mg Oral 2 times daily 08/27/16 1804 08/28/16 1342   08/27/16 2200  clarithromycin (BIAXIN) tablet 500 mg  Status:  Discontinued     500 mg Oral 2 times daily 08/27/16 1804 08/28/16 1342   08/27/16 2100  ceFAZolin (ANCEF) IVPB 2g/100 mL premix     2 g 200 mL/hr over 30 Minutes Intravenous Every 8 hours 08/27/16 1804 08/28/16 0529   08/27/16 1004  ceFAZolin (ANCEF) IVPB 2g/100 mL premix     2 g 200 mL/hr over 30 Minutes Intravenous On  call to O.R. 08/27/16 1004 08/27/16 1300       Patient was given sequential compression devices, early ambulation to prevent DVT.  Patient benefited maximally from hospital stay and there were no complications.    Recent vital signs: BP 131/86 (BP Location: Left Arm)   Pulse 65   Temp 98.6 F (37 C) (Oral)   Resp 20   Ht 6\' 4"  (1.93 m)   Wt 104.3 kg (230 lb)   SpO2 97%   BMI 28.00 kg/m    Discharge Medications:   Allergies as of 08/28/2016      Reactions   No Allergies On File       Medication List    TAKE these medications   amLODipine 5 MG tablet Commonly known as:  NORVASC Take 5 mg by mouth at bedtime.   amoxicillin 500 MG capsule Commonly known as:  AMOXIL Take 1,000 mg by mouth 2 (two) times daily.   aspirin EC 81 MG tablet Take 81 mg by mouth daily.   atorvastatin 40 MG tablet Commonly known as:  LIPITOR Take 40 mg by mouth at bedtime.   benazepril 20 MG tablet Commonly known as:  LOTENSIN Take 20 mg by mouth at bedtime.   clarithromycin 500 MG tablet Commonly known as:  BIAXIN Take 500 mg by mouth 2 (two) times daily.   lurasidone 40 MG Tabs tablet Commonly known as:  LATUDA Take 40 mg  by mouth at bedtime.   omeprazole 40 MG capsule Commonly known as:  PRILOSEC Take 40 mg by mouth daily.       Diagnostic Studies: Dg Chest 2 View  Result Date: 08/27/2016 CLINICAL DATA:  Preop cervical fusion EXAM: CHEST  2 VIEW COMPARISON:  None. FINDINGS: Heart and mediastinal contours are within normal limits. No focal opacities or effusions. No acute bony abnormality. IMPRESSION: No active cardiopulmonary disease. Electronically Signed   By: Charlett NoseKevin  Dover M.D.   On: 08/27/2016 10:45   Dg Cervical Spine 1 View  Result Date: 08/27/2016 CLINICAL DATA:  Status post surgical anterior fusion. EXAM: DG C-ARM 61-120 MIN; DG CERVICAL SPINE - 1 VIEW FLUOROSCOPY TIME:  9 seconds. COMPARISON:  Radiographs of Aug 27, 2016. FINDINGS: Single intraoperative fluoroscopic image  of the cervical spine demonstrates patient be status post surgical anterior fusion of C3-4 and C4-5. Good alignment of the vertebral bodies is noted. IMPRESSION: Status post surgical anterior fusion as described above. Electronically Signed   By: Gabriel RaiderJames  Green Haley, M.D.   On: 08/27/2016 15:59   X-ray Cervical Spine Ap And Lateral  Result Date: 08/27/2016 CLINICAL DATA:  Preop cervical fusion. EXAM: CERVICAL SPINE - 2-3 VIEW COMPARISON:  None. FINDINGS: Degenerative disc disease throughout the cervical spine with anterior spurring and disc space narrowing. Mild diffuse degenerative facet disease. Normal alignment. No fracture. Prevertebral soft tissues are normal. IMPRESSION: Diffuse degenerative disc and facet disease.  No acute findings. Electronically Signed   By: Charlett NoseKevin  Dover M.D.   On: 08/27/2016 10:46   Dg Cerv Spine Flex&ext Only  Result Date: 08/27/2016 CLINICAL DATA:  Preop cervical fusion. EXAM: CERVICAL SPINE - FLEXION AND EXTENSION VIEWS ONLY COMPARISON:  08/27/2016. FINDINGS: Cervical spine is visualized from the occiput to C7. Approximately 3 mm posterior motion at C2-3 with extension. No additional evidence of motion with flexion or extension. Endplate degenerative changes and loss of disc space height at C3-4. Anterior osteophytosis at C4-5 and C5-6. Multilevel uncovertebral hypertrophy. IMPRESSION: 1. Approximately 3 mm posterior motion of C2 on C3 with extension. No additional evidence of pathologic motion. 2. Multilevel degenerative disc disease and uncovertebral hypertrophy. Electronically Signed   By: Leanna BattlesMelinda  Blietz M.D.   On: 08/27/2016 11:10   Dg C-arm 61-120 Min  Result Date: 08/27/2016 CLINICAL DATA:  Status post surgical anterior fusion. EXAM: DG C-ARM 61-120 MIN; DG CERVICAL SPINE - 1 VIEW FLUOROSCOPY TIME:  9 seconds. COMPARISON:  Radiographs of Aug 27, 2016. FINDINGS: Single intraoperative fluoroscopic image of the cervical spine demonstrates patient be status post surgical  anterior fusion of C3-4 and C4-5. Good alignment of the vertebral bodies is noted. IMPRESSION: Status post surgical anterior fusion as described above. Electronically Signed   By: Gabriel RaiderJames  Green Haley, M.D.   On: 08/27/2016 15:59    Disposition: 01-Home or Self Care   POD #1 s/p C3-5 ACDF doing well  - encourage ambulation - Percocet for pain, Valium for muscle spasms -Written scripts for pain signed and in chart -D/C instructions sheet printed and in chart -D/C today  -F/U in office 2 weeks   Signed: Georga BoraMCKENZIE, Ismail Graziani J 09/03/2016, 2:08 PM

## 2016-09-23 ENCOUNTER — Encounter (HOSPITAL_COMMUNITY): Payer: Self-pay | Admitting: Orthopedic Surgery

## 2016-09-29 NOTE — Addendum Note (Signed)
Addendum  created 09/29/16 1448 by Efrat Zuidema, MD   Sign clinical note    

## 2018-01-16 IMAGING — CR DG CERVICAL SPINE FLEX&EXT ONLY
2 series · 2 of 2 positions shown · non-contrast
Comparison: 08/27/2016.

CLINICAL DATA: Preop cervical fusion.

EXAM:
CERVICAL SPINE - FLEXION AND EXTENSION VIEWS ONLY

[w cervical spine flexion]
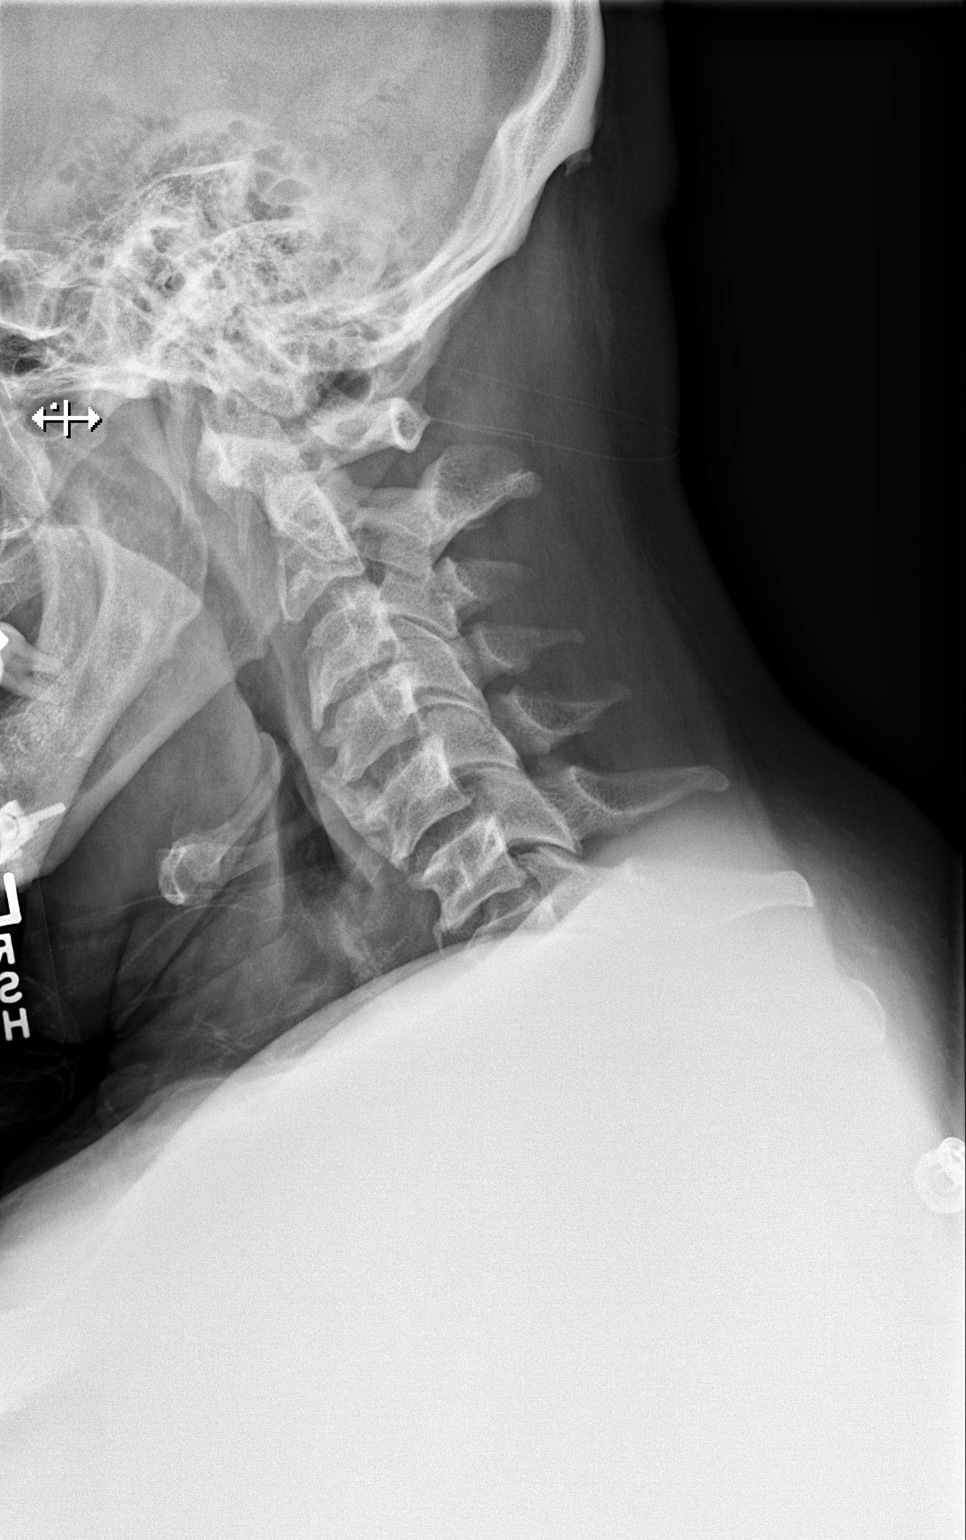

[w cervical spine extension]
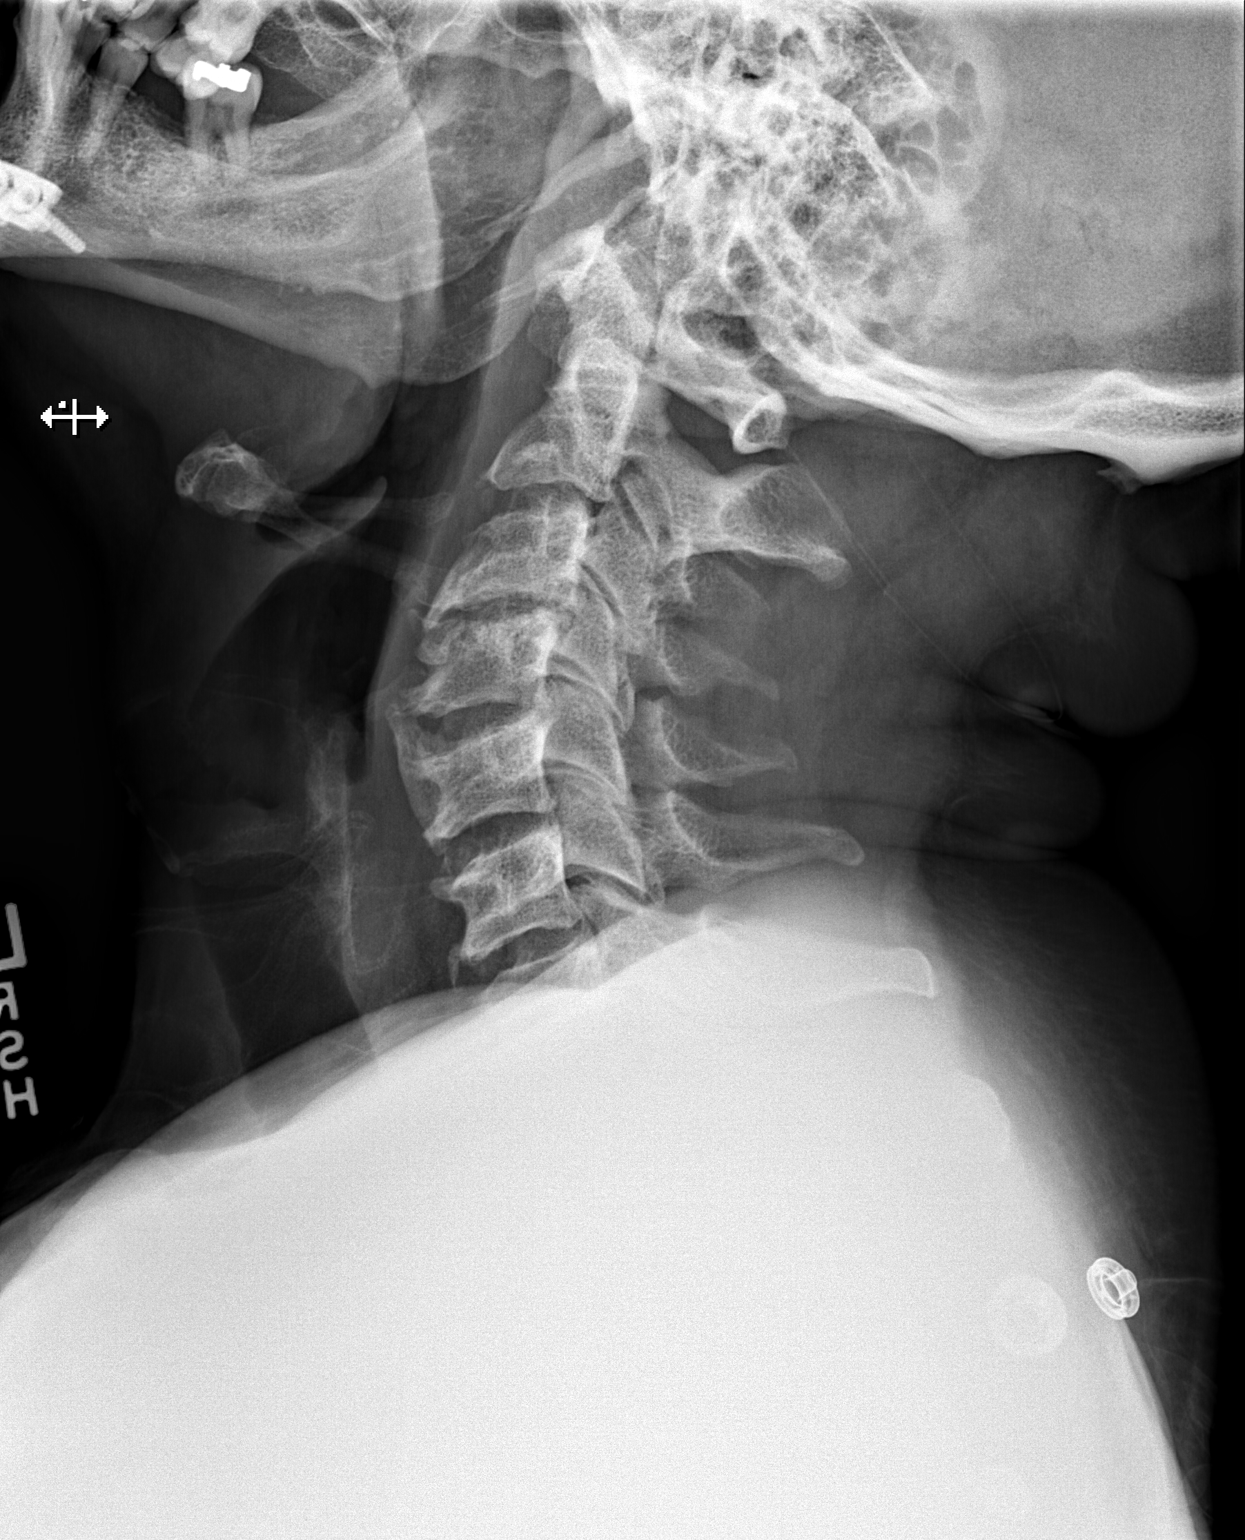

[2 of 2 positions shown; findings below may reference images not displayed]

FINDINGS: Cervical spine is visualized from the occiput to C7. Approximately 3
mm posterior motion at C2-3 with extension. No additional evidence
of motion with flexion or extension. Endplate degenerative changes
and loss of disc space height at C3-4. Anterior osteophytosis at
C4-5 and C5-6. Multilevel uncovertebral hypertrophy.
IMPRESSION: 1. Approximately 3 mm posterior motion of C2 on C3 with extension.
No additional evidence of pathologic motion.
2. Multilevel degenerative disc disease and uncovertebral
hypertrophy.

## 2018-01-16 IMAGING — RF DG CERVICAL SPINE 1V
1 series · 1 of 1 positions shown · non-contrast
Comparison: Radiographs August 27, 2016.

CLINICAL DATA: Status post surgical anterior fusion.

EXAM:
DG C-ARM 61-120 MIN; DG CERVICAL SPINE - 1 VIEW
FLUOROSCOPY TIME:  9 seconds.

[Series 1: run · 1 of 1 slices shown]
[im 1/1]
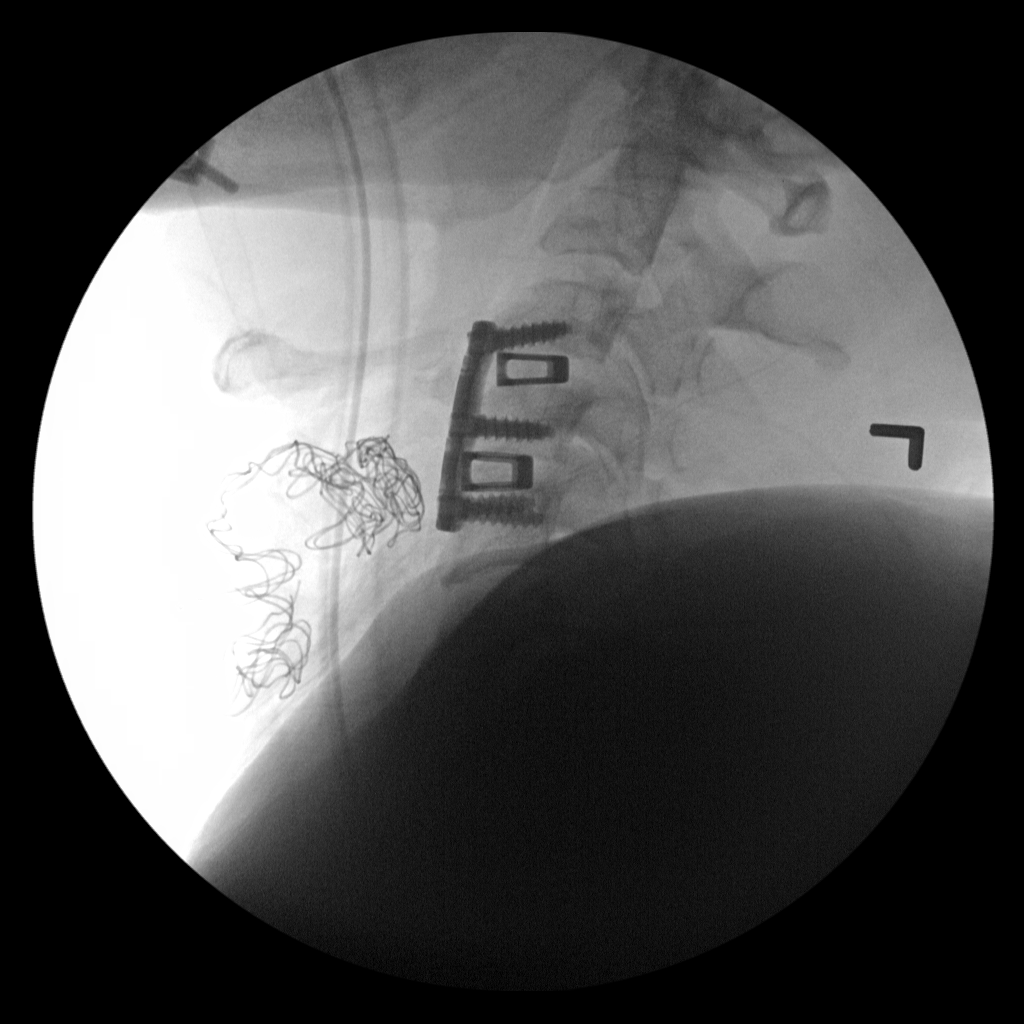

[1 of 1 positions shown; findings below may reference images not displayed]

FINDINGS: Single intraoperative fluoroscopic image of the cervical spine
demonstrates patient be status post surgical anterior fusion of C3-4
and C4-5. Good alignment of the vertebral bodies is noted.
IMPRESSION: Status post surgical anterior fusion as described above.
